# Patient Record
Sex: Male | Born: 2005 | Hispanic: No | Marital: Single | Smoking: Never smoker
Health system: Southern US, Community
[De-identification: ages and names within clinical notes are randomized; demographics above are authoritative.]

---
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
## 2023-02-14 ENCOUNTER — Encounter (HOSPITAL_COMMUNITY): Payer: Self-pay

## 2023-02-14 ENCOUNTER — Ambulatory Visit (HOSPITAL_COMMUNITY)
Admission: EM | Admit: 2023-02-14 | Discharge: 2023-02-14 | Disposition: A | Discharge status: Home / Self Care | Payer: Medicaid Other | Attending: Emergency Medicine | Admitting: Emergency Medicine

## 2023-02-14 DIAGNOSIS — K219 Gastro-esophageal reflux disease without esophagitis: Secondary | ICD-10-CM

## 2023-02-14 MED ORDER — ALUM & MAG HYDROXIDE-SIMETH 200-200-20 MG/5ML PO SUSP
ORAL | Status: AC
  Filled 2023-02-14: qty 30

## 2023-02-14 MED ORDER — LIDOCAINE VISCOUS HCL 2 % MT SOLN
15.0000 mL | Freq: Once | OROMUCOSAL | Status: AC
  Administered 2023-02-14: 15 mL via OROMUCOSAL

## 2023-02-14 MED ORDER — ALUM & MAG HYDROXIDE-SIMETH 200-200-20 MG/5ML PO SUSP
30.0000 mL | Freq: Once | ORAL | Status: AC
  Administered 2023-02-14: 30 mL via ORAL

## 2023-02-14 MED ORDER — OMEPRAZOLE 20 MG PO CPDR: 20.0000 mg | DELAYED_RELEASE_CAPSULE | Freq: Every day | ORAL | 0 refills | Status: AC

## 2023-02-14 MED ORDER — LIDOCAINE VISCOUS HCL 2 % MT SOLN
OROMUCOSAL | Status: AC
  Filled 2023-02-14: qty 15

## 2023-02-14 MED ORDER — CALCIUM CARBONATE ANTACID 500 MG PO CHEW: CHEWABLE_TABLET | ORAL | 0 refills | Status: AC

## 2023-02-14 NOTE — Discharge Instructions (Addendum)
We have given you a GI cocktail to help with your symptoms in clinic, this should start kicking in soon.  Please take the Tums as needed for heartburn.  Please start taking the omeprazole 1 capsule by mouth daily.  Please eat small frequent meals, do not lie down after meals, and avoid foods that are high in fat or highly processed.  Please follow-up with [facility] to establish a primary care provider as you need reevaluation after completing the omeprazole therapy and potential further workup for your symptoms.  Please seek immediate care if your symptoms worsen, you develop worsening abdominal pain, syncope, uncontrolled vomiting, or any changes.  Le hemos dado un cctel gastrointestinal para ayudarle con sus sntomas en la clnica; esto debera comenzar a Air traffic controller. Tome Tums segn sea necesario para la acidez de [place]. Empiece a tomar 1 cpsula de omeprazol por va oral al da. Por favor, coma comidas pequeas y frecuentes, no se acueste despus de las comidas y [employer] alimentos con alto contenido de grasas o altamente procesados.  Haga un seguimiento con [facility] para establecer un proveedor de atencin primaria, ya que necesita una reevaluacin despus de completar la terapia con omeprazol y posibles estudios adicionales para sus sntomas.  Busque atencin inmediata si sus sntomas empeoran, si desarrolla un dolor abdominal que empeora, sncope, vmitos incontrolados o cualquier cambio.

## 2023-02-14 NOTE — ED Provider Notes (Signed)
[facility]    CSN: [id number] Arrival date & time: 02/14/23  3748      History   Chief Complaint Chief Complaint  Patient presents with   Abdominal Pain   Heartburn    HPI Randy Garner is a 17 y.o. male.   Patient presents to clinic with his mother.  About a week and a half ago he noticed epigastric pain that started while he was in class.  He reports he felt like vomiting, he was nauseous, reported chest pain and epigastric pain.  He does have a history of heartburn.  Initially he reported vomiting, upon further questioning he reports he has been coughing up a lot of mucus.  Reports his epigastric pain gets worse when he eats, he does not smoke.   The history is provided by the patient and medical records.  Abdominal Pain Associated symptoms: chest pain, nausea and sore throat   Associated symptoms: no chills, no constipation, no cough, no diarrhea, no dysuria, no fatigue, no shortness of breath and no vomiting   Heartburn Associated symptoms include chest pain and abdominal pain. Pertinent negatives include no headaches and no shortness of breath.    History reviewed. No pertinent past medical history.  There are no problems to display for this patient.   History reviewed. No pertinent surgical history.     Home Medications    Prior to Admission medications   Medication Sig Start Date End Date Taking? Authorizing Provider  calcium carbonate (TUMS) 500 MG chewable tablet 2 to 4 chewable tablets as needed for heartburn. 02/14/23  Yes Rinaldo Ratel, [place] N, FNP  omeprazole (PRILOSEC) 20 MG capsule Take 1 capsule (20 mg total) by mouth daily. 02/14/23 03/16/23 Yes Joaquin Knebel, [place] N, FNP    Family History Family History  Problem Relation Age of Onset   Healthy Mother     Social History Social History   Tobacco Use   Smoking status: Never  Vaping Use   Vaping Use: Never used  Substance Use Topics   Alcohol use: No   Drug use: Never      Allergies   Patient has no known allergies.   Review of Systems Review of Systems  Constitutional:  Negative for chills, diaphoresis and fatigue.  HENT:  Positive for sore throat. Negative for congestion and trouble swallowing.   Eyes:  Negative for discharge.  Respiratory:  Negative for cough and shortness of breath.   Cardiovascular:  Positive for chest pain.  Gastrointestinal:  Positive for abdominal pain, heartburn and nausea. Negative for blood in stool, constipation, diarrhea and vomiting.  Genitourinary:  Negative for dysuria.  Neurological:  Negative for syncope and headaches.     Physical Exam Triage Vital Signs ED Triage Vitals  Enc Vitals Group     BP 02/14/23 0928 (!) 164/91     Pulse Rate 02/14/23 0928 73     Resp 02/14/23 0928 16     Temp 02/14/23 0928 98.2 F (36.8 C)     Temp Source 02/14/23 0928 Oral     SpO2 02/14/23 0928 97 %     Weight --      Height --      Head Circumference --      Peak Flow --      Pain Score 02/14/23 0929 6     Pain Loc --      Pain Edu? --      Excl. in GC? --    No data found.  Updated Vital  Signs BP (!) 164/91 (BP Location: Left Arm)   Pulse 73   Temp 98.2 F (36.8 C) (Oral)   Resp 16   SpO2 97%   Visual Acuity Right Eye Distance:   Left Eye Distance:   Bilateral Distance:    Right Eye Near:   Left Eye Near:    Bilateral Near:     Physical Exam Vitals and nursing note reviewed.  Constitutional:      General: He is not in acute distress.    Appearance: He is well-developed.  HENT:     Head: Normocephalic and atraumatic.  Eyes:     Conjunctiva/sclera: Conjunctivae normal.  Cardiovascular:     Rate and Rhythm: Normal rate and regular rhythm.     Heart sounds: Normal heart sounds. No murmur heard. Pulmonary:     Effort: Pulmonary effort is normal. No respiratory distress.     Breath sounds: Normal breath sounds.  Abdominal:     General: Abdomen is flat. Bowel sounds are normal.     Palpations:  Abdomen is soft.     Tenderness: There is no abdominal tenderness. There is no guarding or rebound.     Hernia: No hernia is present.  Musculoskeletal:        General: No swelling.     Cervical back: Neck supple.  Skin:    General: Skin is warm and dry.     Capillary Refill: Capillary refill takes less than 2 seconds.  Neurological:     Mental Status: He is alert.  Psychiatric:        Mood and Affect: Mood normal.      UC Treatments / Results  Labs (all labs ordered are listed, but only abnormal results are displayed) Labs Reviewed - No data to display  EKG   Radiology No results found.  Procedures Procedures (including critical care time)  Medications Ordered in UC Medications  alum & mag hydroxide-simeth (MAALOX/MYLANTA) 200-200-20 MG/5ML suspension 30 mL (30 mLs Oral Given 02/14/23 1026)  lidocaine (XYLOCAINE) 2 % viscous mouth solution 15 mL (15 mLs Mouth/Throat Given 02/14/23 1026)    Initial Impression / Assessment and Plan / UC Course  I have reviewed the triage vital signs and the nursing notes.  Pertinent labs & imaging results that were available during my care of the patient were reviewed by me and considered in my medical decision making (see chart for details).  Vitals in triage reviewed, patient is hemodynamically stable.  Symptoms and physical exam consistent with acid reflux.  Low concern for cardiac etiology, patient is young, not obese, no family history of cardiac disease, without left-sided chest pain, numbness, diaphoresis or radiation.  Abdomen with active bowel sounds, nontender to palpation, low concern for acute abdomen.  GI cocktail provided in clinic, had some relief, will send home on PPI once daily and PRN tums.  Advised small meals and to avoid high fat or highly processed foods.  Encouraged to follow-up for reevaluation with a primary care provider, information provided.  Patient and mother verbalized understanding, no questions at this time.      Final Clinical Impressions(s) / UC Diagnoses   Final diagnoses:  Gastroesophageal reflux disease, unspecified whether esophagitis present     Discharge Instructions      We have given you a GI cocktail to help with your symptoms in clinic, this should start kicking in soon.  Please take the Tums as needed for heartburn.  Please start taking the omeprazole 1 capsule by mouth daily.  Please eat small frequent meals, do not lie down after meals, and avoid foods that are high in fat or highly processed.  Please follow-up with [facility] to establish a primary care provider as you need reevaluation after completing the omeprazole therapy and potential further workup for your symptoms.  Please seek immediate care if your symptoms worsen, you develop worsening abdominal pain, syncope, uncontrolled vomiting, or any changes.  Le hemos dado un cctel gastrointestinal para ayudarle con sus sntomas en la clnica; esto debera comenzar a Air traffic controllerhacer efecto pronto. Tome Tums segn sea necesario para la acidez de [place]. Empiece a tomar 1 cpsula de omeprazol por va oral al da. Por favor, coma comidas pequeas y frecuentes, no se acueste despus de las comidas y [employer] Corporationevite los alimentos con alto contenido de grasas o altamente procesados.  Haga un [facility]imiento [facility] para establecer un proveedor de atencin primaria, ya que necesita una reevaluacin despus de completar la terapia con omeprazol y posibles estudios adicionales para sus sntomas.  Busque atencin inmediata si sus sntomas empeoran, si desarrolla un dolor abdominal que empeora, sncope, vmitos incontrolados o cualquier cambio.      ED Prescriptions     Medication Sig Dispense Auth. Provider   omeprazole (PRILOSEC) 20 MG capsule Take 1 capsule (20 mg total) by mouth daily. 30 capsule Rinaldo RatelGarrison, [place] N, [place]   calcium carbonate (TUMS) 500 MG chewable tablet 2 to 4  chewable tablets as needed for heartburn. 200 tablet Veatrice Eckstein, [place] N, FNP      PDMP not reviewed this encounter.   Malonie Tatum, [place] N, [place]NP 02/14/23 1048

## 2023-02-14 NOTE — ED Triage Notes (Addendum)
Patient c/o upper mid abdominal pain that radiates into the chest and heartburn x 1 1/2. Patiaent states pain is worse after eating and at night.  Patient has not taken any meds to help with symptoms.

## 2023-02-16 ENCOUNTER — Encounter (HOSPITAL_COMMUNITY): Payer: Self-pay

## 2023-02-16 ENCOUNTER — Other Ambulatory Visit: Payer: Self-pay

## 2023-02-16 ENCOUNTER — Emergency Department (HOSPITAL_COMMUNITY)
Admission: EM | Admit: 2023-02-16 | Discharge: 2023-02-16 | Disposition: A | Discharge status: Home / Self Care | Payer: Medicaid Other | Attending: Emergency Medicine | Admitting: Emergency Medicine

## 2023-02-16 ENCOUNTER — Emergency Department (HOSPITAL_COMMUNITY): Payer: Medicaid Other

## 2023-02-16 DIAGNOSIS — R0602 Shortness of breath: Secondary | ICD-10-CM | POA: Diagnosis not present

## 2023-02-16 DIAGNOSIS — R110 Nausea: Secondary | ICD-10-CM | POA: Insufficient documentation

## 2023-02-16 DIAGNOSIS — R079 Chest pain, unspecified: Secondary | ICD-10-CM | POA: Diagnosis present

## 2023-02-16 DIAGNOSIS — R1013 Epigastric pain: Secondary | ICD-10-CM | POA: Insufficient documentation

## 2023-02-16 LAB — COMPREHENSIVE METABOLIC PANEL
ALT: 13 U/L (ref 0–44)
AST: 22 U/L (ref 15–41)
Albumin: 4.4 g/dL (ref 3.5–5.0)
Alkaline Phosphatase: 75 U/L (ref 38–126)
Anion gap: 13 (ref 5–15)
BUN: 14 mg/dL (ref 6–20)
CO2: 20 mmol/L — ABNORMAL LOW (ref 22–32)
Calcium: 9.3 mg/dL (ref 8.9–10.3)
Chloride: 103 mmol/L (ref 98–111)
Creatinine, Ser: 0.99 mg/dL (ref 0.61–1.24)
GFR, Estimated: >60 mL/min (ref >60)
Glucose, Bld: 118 mg/dL — ABNORMAL HIGH (ref 70–99)
Potassium: 3 mmol/L — ABNORMAL LOW (ref 3.5–5.1)
Sodium: 136 mmol/L (ref 135–145)
Total Bilirubin: 1.1 mg/dL (ref 0.3–1.2)
Total Protein: 7.4 g/dL (ref 6.5–8.1)

## 2023-02-16 LAB — TROPONIN I (HIGH SENSITIVITY): Troponin I (High Sensitivity): <2 ng/L (ref <18)

## 2023-02-16 LAB — CBC
HCT: 40.5 % (ref 39.0–52.0)
Hemoglobin: 14.7 g/dL (ref 13.0–17.0)
MCH: 30.9 pg (ref 26.0–34.0)
MCHC: 36.3 g/dL — ABNORMAL HIGH (ref 30.0–36.0)
MCV: 85.1 fL (ref 80.0–100.0)
Platelets: 232 10*3/uL (ref 150–400)
RBC: 4.76 MIL/uL (ref 4.22–5.81)
RDW: 12.1 % (ref 11.5–15.5)
WBC: 7.6 10*3/uL (ref 4.0–10.5)
nRBC: 0 % (ref 0.0–0.2)

## 2023-02-16 LAB — LIPASE, BLOOD: Lipase: 30 U/L (ref 11–51)

## 2023-02-16 MED ORDER — SUCRALFATE 1 G PO TABS: 1.0000 g | ORAL_TABLET | Freq: Four times a day (QID) | ORAL | 0 refills | Status: AC | PRN

## 2023-02-16 NOTE — ED Provider Notes (Signed)
[facility] Emergency Department Provider Note MRN:  [id number]  Arrival date & time: 02/16/23     Chief Complaint   Abdominal Pain   History of Present Illness   Randy Garner is a 17 y.o. year-old male with no pertinent past medical history presenting to the ED with chief complaint of abdominal pain.  Epigastric and lower chest pain for the past several days.  Feels very nauseated with meals.  Associated with shortness of breath.  Denies fever.  No lower abdominal pain.  Review of Systems  A thorough review of systems was obtained and all systems are negative except as noted in the HPI and PMH.   Patient's Health History   History reviewed. No pertinent past medical history.  History reviewed. No pertinent surgical history.  Family History  Problem Relation Age of Onset   Healthy Mother     Social History   Socioeconomic History   Marital status: Single    Spouse name: Not on file   Number of children: Not on file   Years of education: Not on file   Highest education level: Not on file  Occupational History   Not on file  Tobacco Use   Smoking status: Never   Smokeless tobacco: Not on file  Vaping Use   Vaping Use: Never used  Substance and Sexual Activity   Alcohol use: No   Drug use: Never   Sexual activity: Not on file  Other Topics Concern   Not on file  Social History Narrative   Not on file   Social Determinants of Health   Financial Resource Strain: Not on file  Food Insecurity: Not on file  Transportation Needs: Not on file  Physical Activity: Not on file  Stress: Not on file  Social Connections: Not on file  Intimate Partner Violence: Not on file     Physical Exam   Vitals:   02/16/23 0230  BP: (!) 141/82  Pulse: 65  Resp: 16  Temp: 98.2 F (36.8 C)  SpO2: 99%    CONSTITUTIONAL: Well-appearing, NAD NEURO/PSYCH:  Alert and oriented x 3, no focal deficits EYES:  eyes equal and reactive ENT/NECK:  no  LAD, no JVD CARDIO: Regular rate, well-perfused, normal S1 and S2 PULM:  CTAB no wheezing or rhonchi GI/GU:  non-distended, non-tender MSK/SPINE:  No gross deformities, no edema SKIN:  no rash, atraumatic   *Additional and/or pertinent findings included in MDM below  Diagnostic and Interventional Summary    EKG Interpretation  Date/Time:  Sunday February 16 2023 02:12:43 EDT Ventricular Rate:  86 PR Interval:  162 QRS Duration: 94 QT Interval:  390 QTC Calculation: 466 R Axis:   96 Text Interpretation: Normal sinus rhythm with sinus arrhythmia Rightward axis Borderline ECG No previous ECGs available Confirmed by Kennis Carina [phone]) on 02/16/2023 4:33:33 AM       Labs Reviewed  COMPREHENSIVE METABOLIC PANEL - Abnormal; Notable for the following components:      Result Value   Potassium 3.0 (*)    CO2 20 (*)    Glucose, Bld 118 (*)    All other components within normal limits  CBC - Abnormal; Notable for the following components:   MCHC 36.3 (*)    All other components within normal limits  LIPASE, BLOOD  TROPONIN I (HIGH SENSITIVITY)  TROPONIN I (HIGH SENSITIVITY)    DG Abdomen Acute W/Chest  Final Result      Medications - No data to display   Procedures  /  Critical Care Procedures  ED Course and Medical Decision Making  Initial Impression and Ddx Favoring GERD, also considering pancreatitis, doubt cholecystitis.  Past medical/surgical history that increases complexity of ED encounter: None  Interpretation of Diagnostics I personally reviewed the EKG and my interpretation is as follows: Sinus rhythm  Labs reassuring with no significant blood count or electrolyte disturbance, troponin negative, normal LFTs, renal function.  Normal lipase.  Patient Reassessment and Ultimate Disposition/Management     Appropriate for discharge.  Patient management required discussion with the following services or consulting groups:  None  Complexity of Problems  Addressed Acute illness or injury that poses threat of life of bodily function  Additional Data Reviewed and Analyzed Further history obtained from: Further history from spouse/family member  Additional Factors Impacting ED Encounter Risk Prescriptions  Elmer Sow. Pilar Plate, MD [facility] Emergency Medicine [facility] [email or web address]  Final Clinical Impressions(s) / ED Diagnoses     ICD-10-CM   1. Chest pain, unspecified type  R07.9       ED Discharge Orders          Ordered    sucralfate (CARAFATE) 1 g tablet  4 times daily PRN        02/16/23 0432             Discharge Instructions Discussed with and Provided to Patient:    Discharge Instructions      You were evaluated in the Emergency Department and after careful evaluation, we did not find any emergent condition requiring admission or further testing in the hospital.  Your exam/testing today is overall reassuring.  Blood work and x-ray are normal.  Suspect pain related to acid reflux.  Continue the omeprazole, also use the Carafate as needed for pain.  Please return to the Emergency Department if you experience any worsening of your condition.   Thank you for allowing [place] to be a part of your care.      Sabas Sous, MD 02/16/23 [phone]

## 2023-02-16 NOTE — Discharge Instructions (Signed)
You were evaluated in the Emergency Department and after careful evaluation, we did not find any emergent condition requiring admission or further testing in the hospital.  Your exam/testing today is overall reassuring.  Blood work and x-ray are normal.  Suspect pain related to acid reflux.  Continue the omeprazole, also use the Carafate as needed for pain.  Please return to the Emergency Department if you experience any worsening of your condition.   Thank you for allowing [place] to be a part of your care.

## 2023-02-16 NOTE — ED Triage Notes (Signed)
Pt to ED by POV from home c/o epigastric/substernal test pain which began 2 days ago. Pt was seen at [facility] and treated for reflux. States the discomfort woke him up from sleep tonight. Arrives A+O, VSS, NADN.
